# Patient Record
Sex: Female | Born: 1960 | Hispanic: Yes | Marital: Married | Smoking: Never smoker
Health system: Southern US, Community
[De-identification: ages and names within clinical notes are randomized; demographics above are authoritative.]

## PROBLEM LIST (undated history)

## (undated) DIAGNOSIS — R0789 Other chest pain: Secondary | ICD-10-CM

## (undated) HISTORY — PX: CHOLECYSTECTOMY: SHX55

## (undated) HISTORY — DX: Other chest pain: R07.89

## (undated) HISTORY — PX: APPENDECTOMY: SHX54

---

## 2008-09-06 ENCOUNTER — Observation Stay (HOSPITAL_COMMUNITY): Admission: EM | Admit: 2008-09-06 | Discharge: 2008-09-08 | Payer: Self-pay | Admitting: Internal Medicine

## 2008-09-07 ENCOUNTER — Encounter (INDEPENDENT_AMBULATORY_CARE_PROVIDER_SITE_OTHER): Payer: Self-pay | Admitting: Internal Medicine

## 2008-09-07 ENCOUNTER — Ambulatory Visit: Payer: Self-pay | Admitting: Internal Medicine

## 2008-09-27 DIAGNOSIS — Z9189 Other specified personal risk factors, not elsewhere classified: Secondary | ICD-10-CM

## 2008-09-28 ENCOUNTER — Ambulatory Visit: Payer: Self-pay

## 2008-09-28 ENCOUNTER — Ambulatory Visit: Payer: Self-pay | Admitting: Cardiovascular Disease

## 2008-10-05 ENCOUNTER — Ambulatory Visit: Payer: Self-pay | Admitting: Cardiology

## 2010-11-24 LAB — POCT CARDIAC MARKERS
CKMB, poc: <1 ng/mL — ABNORMAL LOW (ref 1.0–8.0)
Myoglobin, poc: 53.4 ng/mL (ref 12–200)
Myoglobin, poc: 94.2 ng/mL (ref 12–200)
Troponin i, poc: <0.05 ng/mL (ref 0.00–0.09)

## 2010-11-24 LAB — CBC
HCT: 33.2 % — ABNORMAL LOW (ref 36.0–46.0)
MCHC: 32.9 g/dL (ref 30.0–36.0)
Platelets: 286 10*3/uL (ref 150–400)
Platelets: 300 10*3/uL (ref 150–400)
RBC: 4.33 MIL/uL (ref 3.87–5.11)
RDW: 18.4 % — ABNORMAL HIGH (ref 11.5–15.5)
WBC: 10.4 10*3/uL (ref 4.0–10.5)
WBC: 4.2 10*3/uL (ref 4.0–10.5)
WBC: 5.9 10*3/uL (ref 4.0–10.5)

## 2010-11-24 LAB — LIPID PANEL
HDL: 40 mg/dL (ref >39)
Total CHOL/HDL Ratio: 5 RATIO
Triglycerides: 57 mg/dL (ref <150)

## 2010-11-24 LAB — CK TOTAL AND CKMB (NOT AT ARMC): Relative Index: INVALID (ref 0.0–2.5)

## 2010-11-24 LAB — CARDIAC PANEL(CRET KIN+CKTOT+MB+TROPI)
CK, MB: 1.2 ng/mL (ref 0.3–4.0)
CK, MB: 2 ng/mL (ref 0.3–4.0)
Relative Index: INVALID (ref 0.0–2.5)
Relative Index: INVALID (ref 0.0–2.5)
Total CK: 86 U/L (ref 7–177)
Troponin I: <0.01 ng/mL (ref 0.00–0.06)

## 2010-11-24 LAB — COMPREHENSIVE METABOLIC PANEL
ALT: 105 U/L — ABNORMAL HIGH (ref 0–35)
ALT: 82 U/L — ABNORMAL HIGH (ref 0–35)
AST: 136 U/L — ABNORMAL HIGH (ref 0–37)
AST: 78 U/L — ABNORMAL HIGH (ref 0–37)
Albumin: 3.2 g/dL — ABNORMAL LOW (ref 3.5–5.2)
Albumin: 3.2 g/dL — ABNORMAL LOW (ref 3.5–5.2)
Albumin: 3.6 g/dL (ref 3.5–5.2)
Alkaline Phosphatase: 124 U/L — ABNORMAL HIGH (ref 39–117)
Alkaline Phosphatase: 127 U/L — ABNORMAL HIGH (ref 39–117)
BUN: 13 mg/dL (ref 6–23)
Calcium: 8.7 mg/dL (ref 8.4–10.5)
Chloride: 103 mEq/L (ref 96–112)
Chloride: 104 mEq/L (ref 96–112)
Creatinine, Ser: 0.67 mg/dL (ref 0.4–1.2)
GFR calc Af Amer: >60 mL/min (ref >60)
Glucose, Bld: 87 mg/dL (ref 70–99)
Potassium: 3.3 mEq/L — ABNORMAL LOW (ref 3.5–5.1)
Potassium: 3.4 mEq/L — ABNORMAL LOW (ref 3.5–5.1)
Potassium: 3.4 mEq/L — ABNORMAL LOW (ref 3.5–5.1)
Sodium: 135 mEq/L (ref 135–145)
Total Bilirubin: 0.7 mg/dL (ref 0.3–1.2)
Total Bilirubin: 1.1 mg/dL (ref 0.3–1.2)
Total Protein: 6.1 g/dL (ref 6.0–8.3)
Total Protein: 7 g/dL (ref 6.0–8.3)

## 2010-11-24 LAB — IRON AND TIBC
Saturation Ratios: 12 % — ABNORMAL LOW (ref 20–55)
TIBC: 422 ug/dL (ref 250–470)
UIBC: 371 ug/dL

## 2010-11-24 LAB — DIFFERENTIAL
Basophils Absolute: 0 10*3/uL (ref 0.0–0.1)
Basophils Relative: 0 % (ref 0–1)
Eosinophils Absolute: 0 10*3/uL (ref 0.0–0.7)
Eosinophils Relative: 0 % (ref 0–5)
Monocytes Absolute: 0.2 10*3/uL (ref 0.1–1.0)
Monocytes Relative: 2 % — ABNORMAL LOW (ref 3–12)

## 2010-11-24 LAB — POCT PREGNANCY, URINE: Preg Test, Ur: NEGATIVE

## 2010-11-24 LAB — HEPATITIS C ANTIBODY: HCV Ab: NEGATIVE

## 2010-11-24 LAB — HEPATITIS B SURFACE ANTIBODY,QUALITATIVE: Hep B S Ab: NEGATIVE

## 2010-11-24 LAB — VITAMIN B12: Vitamin B-12: 765 pg/mL (ref 211–911)

## 2010-11-24 LAB — FOLATE: Folate: 19.3 ng/mL

## 2010-11-24 LAB — HEPATITIS A ANTIBODY, TOTAL: Hep A Total Ab: POSITIVE — AB

## 2010-11-24 LAB — TSH: TSH: 0.509 u[IU]/mL (ref 0.350–4.500)

## 2010-12-23 NOTE — H&P (Signed)
[name], [name] NO.:  [id number]   MEDICAL RECORD NO.:  [id number]          PATIENT TYPE:  EMS   LOCATION:  MAJO                         FACILITY:  [facility]   PHYSICIAN:  Eduard Clos, MDDATE OF BIRTH:  April 14, 1961   DATE OF ADMISSION:  09/06/2008  DATE OF DISCHARGE:                              HISTORY & PHYSICAL   CHIEF COMPLAINT:  Chest pain.   HISTORY OF PRESENT ILLNESS:  A 50 year old female with no significant  past medical history,  presented to the ER complaining of chest pain  which happened early in the a.m. today.  The chest pain occurred around  8 a.m.  Subsequent to that she also had some nausea.  In the ER the  patient was having more episodes of vomiting.  The patient denies any  abdominal pain, fever, chills, diarrhea or dysuria.  Chest pain is  retrosternal lasted for an hour and a half, and got relieved  spontaneously.  Presently has no chest pain.  There is no relation to  exertion.  No ulcers, shortness of breath or any radiation.  Denies any  palpitation, dizziness or loss of consciousness.  Denies any weakness of  limbs, headache.   PAST MEDICAL HISTORY:  Nothing significant.   PAST SURGICAL HISTORY:  Cholecystectomy and appendectomy.   MEDICATIONS ON ADMISSION:  None.   ALLERGIES:  None.   FAMILY HISTORY:  Noncontributory.   SOCIAL HISTORY:  The patient denies smoking cigarettes, drinking alcohol  or using illegal drugs.   REVIEW OF SYSTEMS:  As per history of present illness.  Nothing else  significant.   PHYSICAL EXAMINATION:  GENERAL:  The patient examined at the bedside not  in acute distress.  VITAL SIGNS:  Blood pressure 110/59, pulse 72, temperature 97.7,  respirations 18, oxygen saturation 100%.  HEENT: Anicteric.  No pallor.  CHEST:  Bilateral air entry present.  No rhonchi on auscultation.  HEART:  S1 and S2.  ABDOMEN:  Soft, nontender.  Bowel sounds heard.  No guarding or  rigidity.  CNS: Alert, awake,  oriented to person.  LOWER EXTREMITIES:  Peripheral pulses felt.  No edema.   LABORATORY DATA:  CBC:  WBC 10.4, hemoglobin 10.7, hematocrit 33.2, MCV  70.8, platelets 300,000, neutrophils 93%.  Complete metabolic panel:  Sodium 135, potassium 3.3, chloride 104, carbon dioxide 23, glucose 108,  BUN 12, creatinine 0.5, total bilirubin 1.1, alkaline phos of 124, AST  136, ALT 82, calcium 8.7, albumin 3.6.  CK-MB less than 1,  troponin-I  less than 0.05.  Pregnancy screen is negative.  EKG shows normal sinus  rhythm with no acute ST-T changes.  Chest x-ray:  Low lung volumes  without focal disease.   ASSESSMENT:  1. Chest pain, rule out acute coronary syndrome.  2. Abnormal liver function tests.   PLAN:  Admit the patient to telemetry, cycle cardiac markers, get a 2-D  echo.  The patient has abnormal LFTs, nausea and vomiting.  The patient  has had a previous cholecystectomy.  Anyway we will do a sonogram of the  abdomen to check for CBD dilatation.  If it is  there, we need an MRCP.  Will follow.  Repeat liver function test in a.m. and presently will  place the patient on cardiac healthy diet.  If the patient is still  having nausea and vomiting, may have to change it to clear liquid diet  or n.p.o.      Eduard Clos, MD  Electronically Signed     ANK/MEDQ  D:  09/06/2008  T:  09/06/2008  Job:  [id number]

## 2010-12-23 NOTE — Discharge Summary (Signed)
[name], [name]            ACCOUNT NO.:  [id number]   MEDICAL RECORD NO.:  [id number]          PATIENT TYPE:  INP   LOCATION:  4710                         FACILITY:  [facility]   PHYSICIAN:  Lucita Ferrara, MD         DATE OF BIRTH:  02/26/1961   DATE OF ADMISSION:  09/06/2008  DATE OF DISCHARGE:  09/08/2008                               DISCHARGE SUMMARY   DISCHARGE DIAGNOSES:  Chest pain, rule out acute coronary syndrome,  acute coronary syndrome ruled out, cardiac enzymes x3 are negative,  abnormal liver function tests including a complete metabolic panel which  showed alkaline phosphatase of 124, AST and ALT of 136 and 82  respectively.   Tests that are pending before discharge right now include hepatitis A,  B, C panel.  Issues to be addressed on followup include:  The patient is  to have an outpatient stress test.  Social worker will set this up with  [facility].  The patient lives in [place].  This  should be a nuclear stress test.   PROCEDURES:  The patient had a 2-D echocardiogram which showed an  ejection fraction of 65%, left ventricular wall thickness was normal and  overall normal systolic function.  The patient had a chest x-ray dated  September 06, 2008, which was normal.  Abdominal ultrasound which was also  negative.  There was some bowel gas pattern.   BRIEF HISTORY OF PRESENT ILLNESS:  [name] is a 50 year old  Hispanic female who presented to [facility] on September 06, 2008, with complaints of chest pain that was atypical.  The chest  pain was associated with some nausea, associated with food.  The patient  denied any fevers, chills, dysuria or specific abdominal pain.  Given  that her liver function tests were also abnormal an abdominal right  upper quadrant ultrasound was ordered and essentially this test proved  to be negative for acute cholecystitis or common bile duct dilatation.  Common bile duct was measured to  be 5.7.  The patient had an ACS  protocol as well.  Her EKG initially was normal.  Her cardiac enzymes x3  were normal.  Fasting lipid profile showed mildly elevated LDL at 150.  The patient was not started on any statins given her abnormal liver  function tests.  The rest of the workup for abnormal liver function  studies including hepatitis is currently pending.  The patient's pain  overall resolved.  The patient was initially put on clear liquid diet  and her chest pain was thought to be secondary to gastritis.  The  patient will be put on Protonix and discharged.  The patient did not  have any nausea or vomiting and tolerated diet.  Her chest pain  resolved.  Again, for full chest pain workup the patient is going to  have an outpatient stress test.   DISCHARGE MEDICATIONS:  1. She is to be discharged on aspirin and will change it to 81 mg p.o.      daily.  2. Protonix 40 mg p.o. daily.   FOLLOWUP:  Again, followup is as above.   DISCHARGE CONDITION:  Stable.     Lucita Ferrara, MD  Electronically Signed    RR/MEDQ  D:  09/08/2008  T:  09/08/2008  Job:  [id number]

## 2010-12-23 NOTE — Discharge Summary (Signed)
[name], [name] NO.:  [id number]   MEDICAL RECORD NO.:  [id number]           PATIENT TYPE:   LOCATION:                                 FACILITY:   PHYSICIAN:  Lucita Ferrara, MD              DATE OF BIRTH:   DATE OF ADMISSION:  DATE OF DISCHARGE:                               DISCHARGE SUMMARY   ADDENDUM:   DISCHARGE MEDICATIONS:  1. Protonix 40 mg p.o. daily.  2. Aspirin over-the-counter 81 mg p.o. daily.   FOLLOWUP:  1. Cardiology from [facility] in [place], phone number      [phone].  The patient is advised to call to make an      appointment.  2. Department of Social Services to apply for Medicaid.  Her social      work has been involved and told her exactly the procedures of this      process, phone number [phone].      Lucita Ferrara, MD  Electronically Signed     RR/MEDQ  D:  09/08/2008  T:  09/08/2008  Job:  [id number]

## 2010-12-23 NOTE — Assessment & Plan Note (Signed)
[facility]                       [place] CARDIOLOGY OFFICE NOTE   NAME:[name]                     MRN:          [id number]  DATE:10/05/2008                            DOB:          12/24/1960    REFERRING PHYSICIAN:  Dr. Lucita Schmitt of [facility].   REASON FOR CONSULTATION:  Chest pain.   HISTORY OF PRESENT ILLNESS:  [name] is a 50 year old Hispanic female  with no history of CAD, who presented to [facility] recently  with complaints of atypical chest pain.  She ruled out for myocardial  infarction by enzymes.  Her echocardiogram demonstrated normal LV  function with an EF of 65-75%.  She was noted to be anemic with an MCV  of 69.90 and a hemoglobin of 9.9.  Her iron studies were abnormal with a  ferritin of 5, B12 and folate were both normal.  She was placed on iron  therapy.  She was also placed on Protonix.  Her LFTs were noted to be  elevated with an alkaline phosphatase of 127, AST of 78 and an ALT of  105.  Abdominal ultrasound demonstrated a normal liver and a normal  common bile duct measuring 5.7 mm.  The pancreas could not be evaluated.  Her lipase was 23.  Hepatitis serologies were abnormal with hepatitis A  antibody being positive, hepatitis B and hepatitis C antibodies were  both negative.  The patient was set up by Dr. Flonnie Overman after discharge for  an exercise treadmill test in our office in [place].  This was done  on September 28, 2008.  The patient exercised for 6 minutes and 30  seconds and achieved 10.7 mets.  She had no evidence of ischemia by ST  analysis.  The patient achieved stage IV.  Her maximal heart rate was  173 beats per minute.  She had a normal blood pressure response.  In the  office today, she notes that she occasionally has chest discomfort from  time to time.  This is atypical.  It only lasts for a few seconds.  She  denies associated shortness of breath, nausea, diaphoresis,  syncope or  near-syncope.  On the day that she had to go to the hospital, she had  some worsening with exertion and some shortness of breath.  She also had  some nausea and vomiting that day as well.   PAST MEDICAL HISTORY:  As outlined above.  She is status post  appendectomy and cholecystectomy.  She denies any history of diabetes  mellitus or hypertension.  During her hospitalization, her lipid panel  was abnormal with total cholesterol of 201, triglycerides 57, HDL 40,  LDL 150.   CURRENT MEDICATIONS:  1. Iron 65 mg daily.  2. Aspirin 81 mg daily.  3. Protonix 40 mg daily.   ALLERGIES:  NO KNOWN DRUG ALLERGIES.   SOCIAL HISTORY:  She denies ever smoking.   FAMILY HISTORY:  Insignificant for premature CAD.   REVIEW OF SYSTEMS:  Please see HPI.  Denies melena, hematochezia or  dysuria.  She is still having periods.  She denies any symptoms of  menometrorrhagia.  The rest of the review of systems are negative.   PHYSICAL EXAMINATION:  GENERAL:  She is a well-nourished, well-developed  female.  VITAL SIGNS:  Blood pressure is 120/76, pulse 68, weight 191 pounds.  HEENT:  Normal.  NECK:  Without JVD.  Carotids without bruits bilaterally.  CARDIAC:  Normal S1-S2.  Regular rate and rhythm without murmur.  LUNGS:  Clear to auscultation bilaterally.  No wheezing, no rhonchi, no  rales.  ABDOMEN:  Soft and nontender with normoactive bowel sounds.  No  organomegaly.  EXTREMITIES:  Without edema.  NEUROLOGIC:  She is alert and oriented x3.  Cranial II-XII are grossly  intact.  SKIN:  Warm and dry.   ASSESSMENT/PLAN:  1. Chest pain.  [name] has very few risk factors and a recent      negative exercise treadmill test.  No further cardiovascular workup      is warranted at this time.  She can follow up with cardiology on a      p.r.n. basis.  2. Dyslipidemia.  She should pursue a trial of therapeutic lifestyle      modifications.  She can have a follow-up lipid panel with  her      primary care [name], Mr. [name] at [facility].  3. Elevated liver function tests.  As noted above, she had a negative      abdominal ultrasound.  She should follow up further with Mr. [name].  4. Microcytic anemia.  She is on iron therapy at this time.  This      seems to be secondary to iron deficiency.  She should continue iron      and follow-up with Mr. [name].   DISPOSITION:  The patient will follow up with cardiology on p.r.n.  basis.      Tereso Newcomer, PA-C  Electronically Signed      Jesse Sans. Daleen Squibb, MD, [facility]  Electronically Signed   SW/MedQ  DD: 10/05/2008  DT: 10/05/2008  Job #: [id number]   cc:   Sandra Ferrara, MD  Sandra Pascal, PA-C

## 2011-07-09 ENCOUNTER — Encounter: Payer: Self-pay | Admitting: Cardiovascular Disease

## 2012-11-22 ENCOUNTER — Ambulatory Visit (INDEPENDENT_AMBULATORY_CARE_PROVIDER_SITE_OTHER): Payer: 59 | Admitting: General Practice

## 2012-11-22 ENCOUNTER — Encounter: Payer: Self-pay | Admitting: General Practice

## 2012-11-22 VITALS — BP 114/67 | HR 56 | Temp 97.7°F | Ht 61.0 in | Wt 164.0 lb

## 2012-11-22 DIAGNOSIS — H8110 Benign paroxysmal vertigo, unspecified ear: Secondary | ICD-10-CM

## 2012-11-22 MED ORDER — MECLIZINE HCL 32 MG PO TABS: 32.0000 mg | ORAL_TABLET | Freq: Three times a day (TID) | ORAL | Status: DC | PRN

## 2012-11-22 NOTE — Progress Notes (Signed)
  Subjective:    Patient ID: [name], female    DOB: 1961/02/05, 52 y.o.   MRN: [id number]  HPI Reports having dizziness and feeling off balance. Reports having similar symptoms 3 years ago and resolved with medication. Denies falling, trauma, or recent head injury.    Review of Systems  Constitutional: Negative for fever and chills.  Respiratory: Negative for chest tightness and shortness of breath.   Cardiovascular: Negative for chest pain and palpitations.  Genitourinary: Negative for difficulty urinating.  Musculoskeletal: Negative.   Skin: Negative.   Neurological: Positive for dizziness and light-headedness.  Psychiatric/Behavioral: Negative.        Objective:   Physical Exam  Constitutional: She is oriented to person, place, and time. She appears well-developed and well-nourished.  HENT:  Head: Normocephalic and atraumatic.  Right Ear: External ear normal.  Left Ear: External ear normal.  Cardiovascular: Normal rate, regular rhythm and normal heart sounds.   No murmur heard. Pulmonary/Chest: Effort normal and breath sounds normal. No respiratory distress. She exhibits no tenderness.  Neurological: She is alert and oriented to person, place, and time.  Skin: Skin is warm and dry.  Psychiatric: She has a normal mood and affect.          Assessment & Plan:  Take medication as prescribed Sedation precautions discussed  Rest when vertigo attack presenting Patient verbalized understanding Raymon Mutton, FNP-C

## 2012-11-22 NOTE — Patient Instructions (Signed)
Vertigo Vertigo means you feel like you or your surroundings are moving when they are not. Vertigo can be dangerous if it occurs when you are at work, driving, or performing difficult activities.  CAUSES  Vertigo occurs when there is a conflict of signals sent to your brain from the visual and sensory systems in your body. There are many different causes of vertigo, including:  Infections, especially in the inner ear.  A bad reaction to a drug or misuse of alcohol and medicines.  Withdrawal from drugs or alcohol.  Rapidly changing positions, such as lying down or rolling over in bed.  A migraine headache.  Decreased blood flow to the brain.  Increased pressure in the brain from a head injury, infection, tumor, or bleeding. SYMPTOMS  You may feel as though the world is spinning around or you are falling to the ground. Because your balance is upset, vertigo can cause nausea and vomiting. You may have involuntary eye movements (nystagmus). DIAGNOSIS  Vertigo is usually diagnosed by physical exam. If the cause of your vertigo is unknown, your caregiver may perform imaging tests, such as an MRI scan (magnetic resonance imaging). TREATMENT  Most cases of vertigo resolve on their own, without treatment. Depending on the cause, your caregiver may prescribe certain medicines. If your vertigo is related to body position issues, your caregiver may recommend movements or procedures to correct the problem. In rare cases, if your vertigo is caused by certain inner ear problems, you may need surgery. HOME CARE INSTRUCTIONS   Follow your caregiver's instructions.  Avoid driving.  Avoid operating heavy machinery.  Avoid performing any tasks that would be dangerous to you or others during a vertigo episode.  Tell your caregiver if you notice that certain medicines seem to be causing your vertigo. Some of the medicines used to treat vertigo episodes can actually make them worse in some people. SEEK  IMMEDIATE MEDICAL CARE IF:   Your medicines do not relieve your vertigo or are making it worse.  You develop problems with talking, walking, weakness, or using your arms, hands, or legs.  You develop severe headaches.  Your nausea or vomiting continues or gets worse.  You develop visual changes.  A family member notices behavioral changes.  Your condition gets worse. MAKE SURE YOU:  Understand these instructions.  Will watch your condition.  Will get help right away if you are not doing well or get worse. Document Released: 05/06/2005 Document Revised: 10/19/2011 Document Reviewed: 02/12/2011 Exit[employer] Patient Information 2013 [employer].  

## 2013-08-31 ENCOUNTER — Telehealth: Payer: Self-pay | Admitting: General Practice

## 2013-08-31 NOTE — Telephone Encounter (Signed)
appt offered for tonight but they wanted one for tomorrow after noon appt given with mmm for 3:45

## 2013-09-01 ENCOUNTER — Ambulatory Visit (INDEPENDENT_AMBULATORY_CARE_PROVIDER_SITE_OTHER): Payer: 59 | Admitting: Nurse Practitioner

## 2013-09-01 ENCOUNTER — Encounter: Payer: Self-pay | Admitting: Nurse Practitioner

## 2013-09-01 VITALS — BP 115/60 | HR 72 | Temp 98.7°F | Ht 61.0 in | Wt 166.0 lb

## 2013-09-01 DIAGNOSIS — J069 Acute upper respiratory infection, unspecified: Secondary | ICD-10-CM

## 2013-09-01 MED ORDER — AZITHROMYCIN 250 MG PO TABS: ORAL_TABLET | ORAL | Status: DC

## 2013-09-01 MED ORDER — HYDROCODONE-HOMATROPINE 5-1.5 MG/5ML PO SYRP: 5.0000 mL | ORAL_SOLUTION | Freq: Three times a day (TID) | ORAL | Status: DC | PRN

## 2013-09-01 MED ORDER — METHYLPREDNISOLONE ACETATE 80 MG/ML IJ SUSP
80.0000 mg | Freq: Once | INTRAMUSCULAR | Status: AC
  Administered 2013-09-01: 80 mg via INTRAMUSCULAR

## 2013-09-01 NOTE — Progress Notes (Signed)
   Subjective:    Patient ID: [name], female    DOB: 01/23/1961, 53 y.o.   MRN: [id number]  HPI Patient in c/o cough and sore throat- started 2-3 weeks ago. No OTC meds    Review of Systems  Constitutional: Negative for fever, chills and fatigue.  HENT: Positive for congestion, ear pain, rhinorrhea and sore throat. Negative for sinus pressure and trouble swallowing.   Respiratory: Positive for cough (nonproductive).   Cardiovascular: Negative.   Genitourinary: Negative.   All other systems reviewed and are negative.       Objective:   Physical Exam  Constitutional: She is oriented to person, place, and time. She appears well-developed and well-nourished.  HENT:  Right Ear: Hearing, tympanic membrane, external ear and ear canal normal.  Left Ear: Hearing, tympanic membrane, external ear and ear canal normal.  Nose: Mucosal edema and rhinorrhea present. Right sinus exhibits no maxillary sinus tenderness and no frontal sinus tenderness. Left sinus exhibits no maxillary sinus tenderness and no frontal sinus tenderness.  Mouth/Throat: Uvula is midline, oropharynx is clear and moist and mucous membranes are normal.  Cardiovascular: Normal rate and normal heart sounds.   Pulmonary/Chest: Effort normal and breath sounds normal. No respiratory distress. She has no wheezes. She has no rales.  Dry hacky cough  Abdominal: Soft. Bowel sounds are normal.  Neurological: She is alert and oriented to person, place, and time.  Skin: Skin is warm and dry.  Psychiatric: She has a normal mood and affect. Her behavior is normal. Judgment and thought content normal.    BP 115/60  Pulse 72  Temp(Src) 98.7 F (37.1 C) (Oral)  Ht 5\' 1"  (1.549 m)  Wt 166 lb (75.297 kg)  BMI 31.38 kg/m2       Assessment & Plan:   1. Upper respiratory infection with cough and congestion    Meds ordered this encounter  Medications  . azithromycin (ZITHROMAX Z-PAK) 250 MG tablet    Sig: As directed    Dispense:  6 each    Refill:  0    Order Specific Question:  Supervising Provider    Answer:  Ernestina PennaMOORE, DONALD W [[id number]]  . HYDROcodone-homatropine (HYCODAN) 5-1.5 MG/5ML syrup    Sig: Take 5 mLs by mouth every 8 (eight) hours as needed for cough.    Dispense:  120 mL    Refill:  0    Order Specific Question:  Supervising Provider    Answer:  Ernestina PennaMOORE, DONALD W [[id number]]  . methylPREDNISolone acetate (DEPO-MEDROL) injection 80 mg    Sig:    1. Take meds as prescribed 2. Use a cool mist humidifier especially during the winter months and when heat has been humid. 3. Use saline nose sprays frequently 4. Saline irrigations of the nose can be very helpful if done frequently.  * 4X daily for 1 week*  * Use of a nettie pot can be helpful with this. Follow directions with this* 5. Drink plenty of fluids 6. Keep thermostat turn down low 7.For any cough or congestion  Use plain Mucinex- regular strength or max strength is fine   * Children- consult with Pharmacist for dosing 8. For fever or aces or pains- take tylenol or ibuprofen appropriate for age and weight.  * for fevers greater than 101 orally you may alternate ibuprofen and tylenol every  3 hours.   Sandra Daphine DeutscherMartin, FNP

## 2013-09-01 NOTE — Patient Instructions (Signed)

## 2014-11-07 ENCOUNTER — Telehealth: Payer: Self-pay | Admitting: Nurse Practitioner

## 2014-11-07 NOTE — Telephone Encounter (Signed)
Pt given appt with Christy tomorrow at 11:25.

## 2014-11-08 ENCOUNTER — Ambulatory Visit: Payer: Self-pay | Admitting: Family

## 2015-05-16 ENCOUNTER — Ambulatory Visit (INDEPENDENT_AMBULATORY_CARE_PROVIDER_SITE_OTHER): Payer: Managed Care, Other (non HMO) | Admitting: *Deleted

## 2015-05-16 DIAGNOSIS — Z23 Encounter for immunization: Secondary | ICD-10-CM

## 2015-08-28 ENCOUNTER — Encounter: Payer: Self-pay | Admitting: Family Medicine

## 2015-08-28 ENCOUNTER — Ambulatory Visit (INDEPENDENT_AMBULATORY_CARE_PROVIDER_SITE_OTHER): Payer: Managed Care, Other (non HMO) | Admitting: Family Medicine

## 2015-08-28 VITALS — BP 136/76 | HR 69 | Temp 97.1°F | Ht 61.0 in | Wt 187.2 lb

## 2015-08-28 DIAGNOSIS — H8110 Benign paroxysmal vertigo, unspecified ear: Secondary | ICD-10-CM | POA: Diagnosis not present

## 2015-08-28 MED ORDER — MECLIZINE HCL 32 MG PO TABS: 32.0000 mg | ORAL_TABLET | Freq: Three times a day (TID) | ORAL | Status: DC | PRN

## 2015-08-28 MED ORDER — ONDANSETRON 4 MG PO TBDP: 4.0000 mg | ORAL_TABLET | Freq: Three times a day (TID) | ORAL | Status: DC | PRN

## 2015-08-28 NOTE — Progress Notes (Signed)
BP 136/76 mmHg  Pulse 69  Temp(Src) 97.1 F (36.2 C) (Oral)  Ht  (1.549 m)  Wt 187 lb 3.2 oz (84.913 kg)  BMI 35.39 kg/m2  LMP 10/21/2012   Subjective:    Patient ID: [name], female    DOB: 27-Jul-1961, 55 y.o.   MRN: [id number]  HPI: [name] is a 55 y.o. female presenting on 08/28/2015 for Dizziness   HPI Dizziness and spinning Patient complains of dizziness and spinning that started last night and has been intermittent this morning as well. She is also had some nausea and some vomiting associated with that as well. She has gotten vertigo previously and this feels exactly like her vertigo. She describes the room is spinning around in circles and she feels like her eyes go up. It is also very positional and when she looks up it is a lot worse. Is also a lot worse when she gets up quickly. She has taken meclizine before and felt like it helped some and wants to try again.  Relevant past medical, surgical, family and social history reviewed and updated as indicated. Interim medical history since our last visit reviewed. Allergies and medications reviewed and updated.  Review of Systems  Constitutional: Negative for fever and chills.  HENT: Negative for congestion, ear discharge and ear pain.   Eyes: Negative for redness and visual disturbance.  Respiratory: Negative for chest tightness and shortness of breath.   Cardiovascular: Negative for chest pain and leg swelling.  Gastrointestinal: Positive for nausea. Negative for vomiting, abdominal pain and diarrhea.  Genitourinary: Negative for dysuria and difficulty urinating.  Musculoskeletal: Negative for back pain and gait problem.  Skin: Negative for rash.  Neurological: Positive for dizziness. Negative for light-headedness, numbness and headaches.  Psychiatric/Behavioral: Negative for behavioral problems and agitation.  All other systems reviewed and are negative.   Per HPI unless specifically indicated  above     Medication List       This list is accurate as of: 08/28/15  5:39 PM.  Always use your most recent med list.               meclizine 32 MG tablet  Commonly known as:  ANTIVERT  Take 1 tablet (32 mg total) by mouth 3 (three) times daily as needed.     ondansetron 4 MG disintegrating tablet  Commonly known as:  ZOFRAN-ODT  Take 1 tablet (4 mg total) by mouth every 8 (eight) hours as needed for nausea or vomiting.           Objective:    BP 136/76 mmHg  Pulse 69  Temp(Src) 97.1 F (36.2 C) (Oral)  Ht  (1.549 m)  Wt 187 lb 3.2 oz (84.913 kg)  BMI 35.39 kg/m2  LMP 10/21/2012  Wt Readings from Last 3 Encounters:  08/28/15 187 lb 3.2 oz (84.913 kg)  09/01/13 166 lb (75.297 kg)  11/22/12 164 lb (74.39 kg)    Physical Exam  Constitutional: She is oriented to person, place, and time. She appears well-developed and well-nourished. No distress.  Eyes: Conjunctivae and EOM are normal. Pupils are equal, round, and reactive to light.  Neck: Neck supple. No thyromegaly present.  Cardiovascular: Normal rate, regular rhythm, normal heart sounds and intact distal pulses.   No murmur heard. Pulmonary/Chest: Effort normal and breath sounds normal. No respiratory distress. She has no wheezes.  Musculoskeletal: Normal range of motion. She exhibits no edema or tenderness.  Lymphadenopathy:    She has  no cervical adenopathy.  Neurological: She is alert and oriented to person, place, and time. Coordination normal.  Skin: Skin is warm and dry. No rash noted. She is not diaphoretic.  Psychiatric: She has a normal mood and affect. Her behavior is normal.  Nursing note and vitals reviewed.     Assessment & Plan:   Problem List Items Addressed This Visit    None    Visit Diagnoses    Benign paroxysmal positional vertigo, unspecified laterality    -  Primary    Unable to elicit today, give meclizine and Zofran, if persists then may send to ENT if needed    Relevant  Medications    meclizine (ANTIVERT) 32 MG tablet    ondansetron (ZOFRAN-ODT) 4 MG disintegrating tablet        Follow up plan: Return if symptoms worsen or fail to improve, for Return for yearly labs and well exam.  Counseling provided for all of the vaccine components No orders of the defined types were placed in this encounter.    Arville Care, MD [facility] 08/28/2015, 5:39 PM

## 2016-02-27 ENCOUNTER — Telehealth: Payer: Self-pay | Admitting: Nurse Practitioner

## 2016-02-27 NOTE — Telephone Encounter (Signed)
[facility]

## 2016-05-12 ENCOUNTER — Ambulatory Visit (INDEPENDENT_AMBULATORY_CARE_PROVIDER_SITE_OTHER): Payer: Managed Care, Other (non HMO)

## 2016-05-12 DIAGNOSIS — Z23 Encounter for immunization: Secondary | ICD-10-CM

## 2018-05-13 ENCOUNTER — Ambulatory Visit (INDEPENDENT_AMBULATORY_CARE_PROVIDER_SITE_OTHER): Payer: Managed Care, Other (non HMO) | Admitting: Family Medicine

## 2018-05-13 ENCOUNTER — Encounter: Payer: Self-pay | Admitting: Family Medicine

## 2018-05-13 DIAGNOSIS — Z23 Encounter for immunization: Secondary | ICD-10-CM

## 2018-05-13 DIAGNOSIS — M7918 Myalgia, other site: Secondary | ICD-10-CM
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
## 2018-05-19 ENCOUNTER — Telehealth: Payer: Self-pay | Admitting: Nurse Practitioner

## 2018-05-19 NOTE — Telephone Encounter (Signed)
Letter written. Lm-cb 10/10

## 2018-05-25 NOTE — Telephone Encounter (Signed)
Phone call never returned, encounter closed

## 2018-12-22 ENCOUNTER — Telehealth: Payer: Self-pay | Admitting: Nurse Practitioner

## 2019-08-16 ENCOUNTER — Telehealth: Payer: Self-pay | Admitting: Nurse Practitioner

## 2019-08-16 ENCOUNTER — Ambulatory Visit: Payer: Self-pay | Admitting: Family Medicine
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
## 2019-09-05 ENCOUNTER — Ambulatory Visit: Payer: Self-pay | Admitting: Family Medicine

## 2020-07-26 ENCOUNTER — Encounter: Payer: Self-pay | Admitting: Family Medicine

## 2020-07-26 ENCOUNTER — Ambulatory Visit (INDEPENDENT_AMBULATORY_CARE_PROVIDER_SITE_OTHER): Payer: BC Managed Care – PPO | Admitting: Family Medicine

## 2020-07-26 DIAGNOSIS — Z8616 Personal history of COVID-19: Secondary | ICD-10-CM | POA: Diagnosis not present

## 2020-07-26 DIAGNOSIS — R059 Cough, unspecified: Secondary | ICD-10-CM

## 2020-07-26 DIAGNOSIS — U071 COVID-19: Secondary | ICD-10-CM

## 2020-07-26 MED ORDER — BENZONATATE 100 MG PO CAPS: 100.0000 mg | ORAL_CAPSULE | Freq: Two times a day (BID) | ORAL | 0 refills | Status: DC | PRN

## 2020-07-26 MED ORDER — ALBUTEROL SULFATE HFA 108 (90 BASE) MCG/ACT IN AERS: 2.0000 | INHALATION_SPRAY | Freq: Four times a day (QID) | RESPIRATORY_TRACT | 0 refills | Status: DC | PRN

## 2020-07-26 MED ORDER — DEXAMETHASONE 2 MG PO TABS: ORAL_TABLET | ORAL | 0 refills | Status: DC

## 2020-07-26 NOTE — Progress Notes (Signed)
Virtual Visit via telephone Note  I connected with [name] on 07/26/20 at 1300 by telephone and verified that I am speaking with the correct person using two identifiers. [name] is currently located at home and daughter are currently with her during visit. The provider, Elige Radon Alban Marucci, MD is located in their office at time of visit.  Call ended at 1313  I discussed the limitations, risks, security and privacy concerns of performing an evaluation and management service by telephone and the availability of in person appointments. I also discussed with the patient that there may be a patient responsible charge related to this service. The patient expressed understanding and agreed to proceed.   History and Present Illness: Patient was diagnosed with covid 07/12/20.  She was sick with cough and fevers and chills.  Now she just has cough and is worse at night. She has O2 98%. HR 65. She does not have any other symptoms except cough.  She lost taste and smell but they are coming back.   No diagnosis found.  Outpatient Encounter Medications as of 07/26/2020  Medication Sig  . cyclobenzaprine (FLEXERIL) 5 MG tablet Take 5 mg by mouth 3 (three) times daily.  Marland Kitchen ibuprofen (ADVIL,MOTRIN) 800 MG tablet Take 800 mg by mouth every 8 (eight) hours as needed.  . traMADol (ULTRAM) 50 MG tablet Take 50 mg by mouth every 8 (eight) hours as needed.   No facility-administered encounter medications on file as of 07/26/2020.    Review of Systems  Constitutional: Negative for chills and fever.  HENT: Positive for congestion. Negative for ear discharge, ear pain, postnasal drip, rhinorrhea, sinus pressure, sneezing and sore throat.   Eyes: Negative for pain, redness and visual disturbance.  Respiratory: Positive for cough. Negative for chest tightness, shortness of breath and wheezing.   Cardiovascular: Negative for chest pain and leg swelling.  Genitourinary: Negative for difficulty  urinating and dysuria.  Musculoskeletal: Negative for back pain and gait problem.  Skin: Negative for rash.  Neurological: Negative for light-headedness and headaches.  Psychiatric/Behavioral: Negative for agitation and behavioral problems.  All other systems reviewed and are negative.   Observations/Objective: Patient sounds comfortable and in no acute distress  Assessment and Plan: Problem List Items Addressed This Visit   None   Visit Diagnoses    COVID-19 virus infection    -  Primary   Relevant Medications   dexamethasone (DECADRON) 2 MG tablet   albuterol (VENTOLIN HFA) 108 (90 Base) MCG/ACT inhaler   benzonatate (TESSALON) 100 MG capsule   Cough       Relevant Medications   dexamethasone (DECADRON) 2 MG tablet   albuterol (VENTOLIN HFA) 108 (90 Base) MCG/ACT inhaler   benzonatate (TESSALON) 100 MG capsule     Sounds like patient is just still getting over the cough part of the Covid, sounds like she is improving, will give her some medicines to help clear the inflammation from the cough. Follow up plan: Return if symptoms worsen or fail to improve.     I discussed the assessment and treatment plan with the patient. The patient was provided an opportunity to ask questions and all were answered. The patient agreed with the plan and demonstrated an understanding of the instructions.   The patient was advised to call back or seek an in-person evaluation if the symptoms worsen or if the condition fails to improve as anticipated.  The above assessment and management plan was discussed with the patient. The patient verbalized  understanding of and has agreed to the management plan. Patient is aware to call the clinic if symptoms persist or worsen. Patient is aware when to return to the clinic for a follow-up visit. Patient educated on when it is appropriate to go to the emergency department.    I provided 13 minutes of non-face-to-face time during this encounter.    Nils Pyle, MD

## 2020-09-10 ENCOUNTER — Encounter: Payer: Self-pay | Admitting: Nurse Practitioner

## 2020-09-10 ENCOUNTER — Ambulatory Visit (INDEPENDENT_AMBULATORY_CARE_PROVIDER_SITE_OTHER): Payer: BC Managed Care – PPO | Admitting: Nurse Practitioner

## 2020-09-10 ENCOUNTER — Ambulatory Visit (INDEPENDENT_AMBULATORY_CARE_PROVIDER_SITE_OTHER): Payer: BC Managed Care – PPO

## 2020-09-10 ENCOUNTER — Other Ambulatory Visit: Payer: Self-pay

## 2020-09-10 VITALS — BP 128/69 | HR 52 | Temp 97.1°F | Resp 20 | Ht 61.0 in | Wt 185.0 lb

## 2020-09-10 DIAGNOSIS — M25562 Pain in left knee: Secondary | ICD-10-CM

## 2020-09-10 DIAGNOSIS — M25561 Pain in right knee: Secondary | ICD-10-CM | POA: Diagnosis not present

## 2020-09-10 MED ORDER — DICLOFENAC SODIUM 75 MG PO TBEC: 75.0000 mg | DELAYED_RELEASE_TABLET | Freq: Two times a day (BID) | ORAL | 0 refills | Status: AC

## 2020-09-10 NOTE — Progress Notes (Signed)
Acute Office Visit  Subjective:    Patient ID: [name], female    DOB: February 26, 1961, 60 y.o.   MRN: [id number]  Chief Complaint  Patient presents with  . Knee Pain    Bilateral     Knee Pain  The incident occurred more than 1 week ago. The incident occurred at work. The injury mechanism was a fall. The pain is present in the right knee and left knee. The pain is at a severity of 5/10. The pain is moderate. The pain has been intermittent since onset. Associated symptoms include an inability to bear weight and muscle weakness. She reports no foreign bodies present. The symptoms are aggravated by movement and palpation. She has tried nothing for the symptoms.     Past Medical History:  Diagnosis Date  . Atypical chest pain     Past Surgical History:  Procedure Laterality Date  . APPENDECTOMY    . CHOLECYSTECTOMY      Family History  Problem Relation Age of Onset  . Diabetes Mother   . Hypertension Neg Hx   . Coronary artery disease Neg Hx     Social History   Socioeconomic History  . Marital status: Married    Spouse name: Not on file  . Number of children: Not on file  . Years of education: Not on file  . Highest education level: Not on file  Occupational History  . Not on file  Tobacco Use  . Smoking status: Never Smoker  . Smokeless tobacco: Never Used  Substance and Sexual Activity  . Alcohol use: No  . Drug use: No  . Sexual activity: Not on file  Other Topics Concern  . Not on file  Social History Narrative  . Not on file   Social Determinants of Health   Financial Resource Strain: Not on file  Food Insecurity: Not on file  Transportation Needs: Not on file  Physical Activity: Not on file  Stress: Not on file  Social Connections: Not on file  Intimate Partner Violence: Not on file    Outpatient Medications Prior to Visit  Medication Sig Dispense Refill  . ferrous sulfate 325 (65 FE) MG tablet Take 1 tablet by mouth daily.    Marland Kitchen  albuterol (VENTOLIN HFA) 108 (90 Base) MCG/ACT inhaler Inhale 2 puffs into the lungs every 6 (six) hours as needed for wheezing or shortness of breath. 8 g 0  . benzonatate (TESSALON) 100 MG capsule Take 1 capsule (100 mg total) by mouth 2 (two) times daily as needed for cough. 20 capsule 0  . cyclobenzaprine (FLEXERIL) 5 MG tablet Take 5 mg by mouth 3 (three) times daily.  0  . dexamethasone (DECADRON) 2 MG tablet Take 4 tablets for 3 days then 3 tablets for 3 days then 2 tablets for 3 days then 1 tablet for 3 days 30 tablet 0  . ibuprofen (ADVIL,MOTRIN) 800 MG tablet Take 800 mg by mouth every 8 (eight) hours as needed.  0  . traMADol (ULTRAM) 50 MG tablet Take 50 mg by mouth every 8 (eight) hours as needed.  0   No facility-administered medications prior to visit.    No Known Allergies  Review of Systems  Constitutional: Negative.   HENT: Negative.   Eyes: Negative.   Respiratory: Negative.   Cardiovascular: Negative.   Genitourinary: Negative.   Musculoskeletal: Positive for arthralgias and myalgias.  Skin: Negative.   Neurological: Negative.   All other systems reviewed and are negative.  Objective:    Physical Exam Vitals reviewed.  Constitutional:      Appearance: Normal appearance. She is obese.  HENT:     Head: Normocephalic.     Nose: Nose normal.  Eyes:     Conjunctiva/sclera: Conjunctivae normal.  Cardiovascular:     Rate and Rhythm: Normal rate and regular rhythm.     Pulses: Normal pulses.     Heart sounds: Normal heart sounds.  Pulmonary:     Effort: Pulmonary effort is normal.     Breath sounds: Normal breath sounds.  Abdominal:     General: Bowel sounds are normal.  Musculoskeletal:        General: Tenderness present.  Skin:    General: Skin is warm.     Findings: Bruising present.  Neurological:     Mental Status: She is alert and oriented to person, place, and time.  Psychiatric:        Behavior: Behavior normal.     BP 128/69   Pulse  (!) 52   Temp (!) 97.1 F (36.2 C)   Resp 20   Ht 5\' 1"  (1.549 m)   Wt 185 lb (83.9 kg)   LMP 10/21/2012   SpO2 98%   BMI 34.96 kg/m  Wt Readings from Last 3 Encounters:  09/10/20 185 lb (83.9 kg)  05/13/18 206 lb (93.4 kg)  08/28/15 187 lb 3.2 oz (84.9 kg)    Health Maintenance Due  Topic Date Due  . HIV Screening  Never done  . TETANUS/TDAP  Never done  . COLONOSCOPY (Pts 45-83yrs Insurance coverage will need to be confirmed)  Never done  . MAMMOGRAM  02/12/2018  . PAP SMEAR-Modifier  07/02/2018  . INFLUENZA VACCINE  03/10/2020    There are no preventive care reminders to display for this patient.    Lab Results  Component Value Date   WBC 4.2 09/08/2008   HGB 9.9 (L) 09/08/2008   HCT 30.3 (L) 09/08/2008   MCV 69.9 (L) 09/08/2008   PLT 285 09/08/2008   Lab Results  Component Value Date   NA 138 09/08/2008   K 3.4 (L) 09/08/2008   CO2 24 09/08/2008   GLUCOSE 87 09/08/2008   BUN 13 09/08/2008   CREATININE 0.67 09/08/2008   BILITOT 0.6 09/08/2008   ALKPHOS 108 09/08/2008   AST 32 09/08/2008   ALT 74 (H) 09/08/2008   PROT 6.1 09/08/2008   ALBUMIN 3.2 (L) 09/08/2008   CALCIUM 8.7 09/08/2008   Lab Results  Component Value Date   CHOL (H) 09/06/2008    201        ATP III CLASSIFICATION:  <200     mg/dL   Desirable  09/08/2008  mg/dL   Borderline High  546-503    mg/dL   High          Lab Results  Component Value Date   HDL 40 09/06/2008   Lab Results  Component Value Date   LDLCALC (H) 09/06/2008    150        Total Cholesterol/HDL:CHD Risk Coronary Heart Disease Risk Table                     Men   Women  1/2 Average Risk   3.4   3.3  Average Risk       5.0   4.4  2 X Average Risk   9.6   7.1  3 X Average Risk  23.4   11.0  Use the calculated Patient Ratio above and the CHD Risk Table to determine the patient's CHD Risk.        ATP III CLASSIFICATION (LDL):  <100     mg/dL   Optimal  726-203  mg/dL   Near or Above                     Optimal  130-159  mg/dL   Borderline  559-741  mg/dL   High  >638     mg/dL   Very High   Lab Results  Component Value Date   TRIG 57 09/06/2008   Lab Results  Component Value Date   CHOLHDL 5.0 09/06/2008   No results found for: HGBA1C     Assessment & Plan:   Problem List Items Addressed This Visit      Other   Pain in both knees - Primary    Bilateral knee pain not controlled.  Patient reports fall accident  at work 3 years ago where she hit her knee on the palate.  patient rates pain 5/10.  Denies numbness and tingling. Started patient on Voltaren 75 mg tablet twice daily. Completed x-ray bilateral knee with results pending. Education provided to patient with printed handouts given. Rx sent to pharmacy. Follow-up with worsening or unresolved symptoms.       Relevant Orders   DG Knee 1-2 Views Left   DG Knee 1-2 Views Right       Meds ordered this encounter  Medications  . diclofenac (VOLTAREN) 75 MG EC tablet    Sig: Take 1 tablet (75 mg total) by mouth 2 (two) times daily.    Dispense:  30 tablet    Refill:  0    Order Specific Question:   Supervising Provider    AnswerCorey Harold     Daryll Drown, NP

## 2020-09-10 NOTE — Patient Instructions (Signed)
Acute Knee Pain, Adult Many things can cause knee pain. Sometimes, knee pain is sudden (acute) and may be caused by damage, swelling, or irritation of the muscles and tissues that support your knee. The pain often goes away on its own with time and rest. If the pain does not go away, tests may be done to find out what is causing the pain. Follow these instructions at home: If you have a knee sleeve or brace:  Wear the knee sleeve or brace as told by your doctor. Take it off only as told by your doctor.  Loosen it if your toes: ? Tingle. ? Become numb. ? Turn cold and blue.  Keep it clean.  If the knee sleeve or brace is not waterproof: ? Do not let it get wet. ? Cover it with a watertight covering when you take a bath or shower.   Activity  Rest your knee.  Do not do things that cause pain or make pain worse.  Avoid activities where both feet leave the ground at the same time (high-impact activities). Examples are running, jumping rope, and doing jumping jacks.  Work with a physical therapist to make a safe exercise program, as told by your doctor. Managing pain, stiffness, and swelling  If told, put ice on the knee. To do this: ? If you have a removable knee sleeve or brace, take it off as told by your doctor. ? Put ice in a plastic bag. ? Place a towel between your skin and the bag. ? Leave the ice on for 20 minutes, 2-3 times a day. ? Take off the ice if your skin turns bright red. This is very important. If you cannot feel pain, heat, or cold, you have a greater risk of damage to the area.  If told, use an elastic bandage to put pressure (compression) on your injured knee.  Raise your knee above the level of your heart while you are sitting or lying down.  Sleep with a pillow under your knee.   General instructions  Take over-the-counter and prescription medicines only as told by your doctor.  Do not smoke or use any products that contain nicotine or tobacco. If you  need help quitting, ask your doctor.  If you are overweight, work with your doctor and a food expert (dietitian) to set goals to lose weight. Being overweight can make your knee hurt more.  Watch for any changes in your symptoms.  Keep all follow-up visits. Contact a doctor if:  The knee pain does not stop.  The knee pain changes or gets worse.  You have a fever along with knee pain.  Your knee is red or feels warm when you touch it.  Your knee gives out or locks up. Get help right away if:  Your knee swells, and the swelling gets worse.  You cannot move your knee.  You have very bad knee pain that does not get better with pain medicine. Summary  Many things can cause knee pain. The pain often goes away on its own with time and rest.  Your doctor may do tests to find out the cause of the pain.  Watch for any changes in your symptoms. Relieve your pain with rest, medicines, light activity, and use of ice.  Get help right away if you cannot move your knee or your knee pain is very bad. This information is not intended to replace advice given to you by your health care provider. Make sure you discuss   any questions you have with your health care provider. Document Revised: 01/10/2020 Document Reviewed: 01/10/2020 Elsevier Patient Education  2021 [employer]  

## 2020-09-10 NOTE — Assessment & Plan Note (Signed)
Bilateral knee pain not controlled.  Patient reports fall accident  at work 3 years ago where she hit her knee on the palate.  patient rates pain 5/10.  Denies numbness and tingling. Started patient on Voltaren 75 mg tablet twice daily. Completed x-ray bilateral knee with results pending. Education provided to patient with printed handouts given. Rx sent to pharmacy. Follow-up with worsening or unresolved symptoms.

## 2022-05-12 ENCOUNTER — Ambulatory Visit (INDEPENDENT_AMBULATORY_CARE_PROVIDER_SITE_OTHER): Payer: BC Managed Care – PPO | Admitting: Nurse Practitioner

## 2022-05-12 ENCOUNTER — Telehealth: Payer: Self-pay | Admitting: Nurse Practitioner

## 2022-05-12 ENCOUNTER — Other Ambulatory Visit: Payer: Self-pay | Admitting: Nurse Practitioner

## 2022-05-12 ENCOUNTER — Encounter: Payer: Self-pay | Admitting: Nurse Practitioner

## 2022-05-12 DIAGNOSIS — U071 COVID-19: Secondary | ICD-10-CM

## 2022-05-12 DIAGNOSIS — J069 Acute upper respiratory infection, unspecified: Secondary | ICD-10-CM

## 2022-05-12 MED ORDER — MOLNUPIRAVIR EUA 200MG CAPSULE: 4.0000 | ORAL_CAPSULE | Freq: Two times a day (BID) | ORAL | 0 refills | Status: AC

## 2022-05-12 MED ORDER — BENZONATATE 100 MG PO CAPS: 100.0000 mg | ORAL_CAPSULE | Freq: Three times a day (TID) | ORAL | 0 refills | Status: AC | PRN

## 2022-05-12 MED ORDER — GUAIFENESIN ER 600 MG PO TB12: 600.0000 mg | ORAL_TABLET | Freq: Two times a day (BID) | ORAL | 0 refills | Status: AC

## 2022-05-12 NOTE — Progress Notes (Signed)
   Virtual Visit  Note Due to COVID-19 pandemic this visit was conducted virtually. This visit type was conducted due to national recommendations for restrictions regarding the COVID-19 Pandemic (e.g. social distancing, sheltering in place) in an effort to limit this patient's exposure and mitigate transmission in our community. All issues noted in this document were discussed and addressed.  A physical exam was not performed with this format.  I connected with [name] on 05/12/22 at 1:24 pm by telephone and verified that I am speaking with the correct person using two identifiers. [name] is currently located at home during visit. The provider, Ivy Lynn, NP is located in their office at time of visit.  I discussed the limitations, risks, security and privacy concerns of performing an evaluation and management service by telephone and the availability of in person appointments. I also discussed with the patient that there may be a patient responsible charge related to this service. The patient expressed understanding and agreed to proceed.   History and Present Illness:  URI  This is a new problem. Episode onset: in the past 2 days. The problem has been unchanged. There has been no fever. Associated symptoms include congestion and coughing. Pertinent negatives include no abdominal pain, headaches, rash, sore throat, swollen glands, vomiting or wheezing. Treatments tried: night quill. The treatment provided no relief.      Review of Systems  Constitutional: Negative.  Negative for chills and fever.  HENT:  Positive for congestion. Negative for sore throat.   Respiratory:  Positive for cough. Negative for wheezing.   Cardiovascular: Negative.   Gastrointestinal:  Negative for abdominal pain and vomiting.  Skin: Negative.  Negative for itching and rash.  Neurological:  Negative for headaches.  All other systems reviewed and are  negative.    Observations/Objective: Tele-visit   Assessment and Plan: Presents with cough, congestion in the past 2-3 days.  Take meds as prescribed - Use a cool mist humidifier  -Use saline nose sprays frequently -Force fluids -For fever or aches or pains- take Tylenol or ibuprofen. -If symptoms do not improve, she may need to be COVID tested to rule this out   Follow Up Instructions: Follow up with unresolved symptoms    I discussed the assessment and treatment plan with the patient. The patient was provided an opportunity to ask questions and all were answered. The patient agreed with the plan and demonstrated an understanding of the instructions.   The patient was advised to call back or seek an in-person evaluation if the symptoms worsen or if the condition fails to improve as anticipated.  The above assessment and management plan was discussed with the patient. The patient verbalized understanding of and has agreed to the management plan. Patient is aware to call the clinic if symptoms persist or worsen. Patient is aware when to return to the clinic for a follow-up visit. Patient educated on when it is appropriate to go to the emergency department.   Time call ended:  1:35 pm   I provided 11 minutes of  non face-to-face time during this encounter.    Ivy Lynn, NP

## 2022-05-12 NOTE — Telephone Encounter (Signed)
I sent antiviral medication to patients pharmacy

## 2022-05-13 NOTE — Telephone Encounter (Signed)
Aware and verbalizes understanding.  

## 2022-09-09 IMAGING — DX DG KNEE 1-2V*R*
2 series · 2 of 2 positions shown · non-contrast
Comparison: No prior.

CLINICAL DATA: Bilateral knee pain.

EXAM:
RIGHT KNEE - 1-2 VIEW

[knee ap]
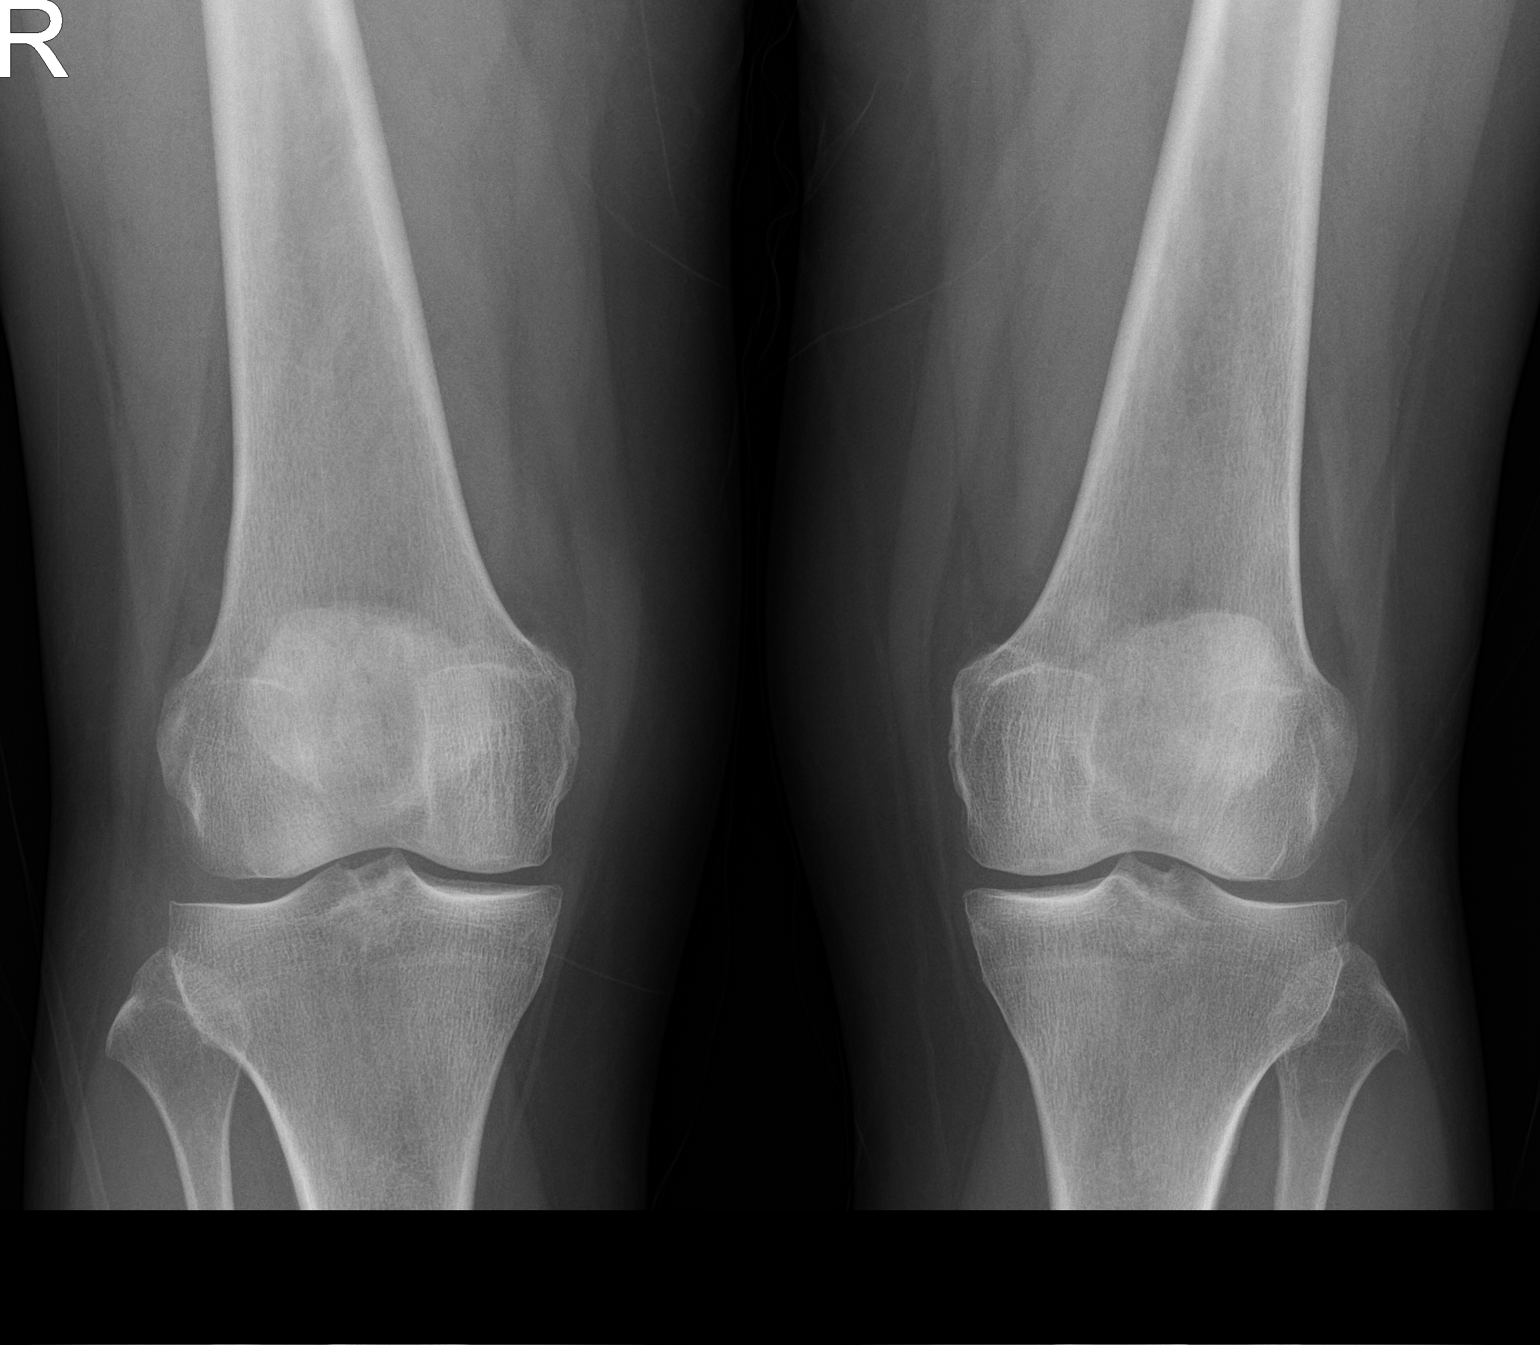

[knee lat]
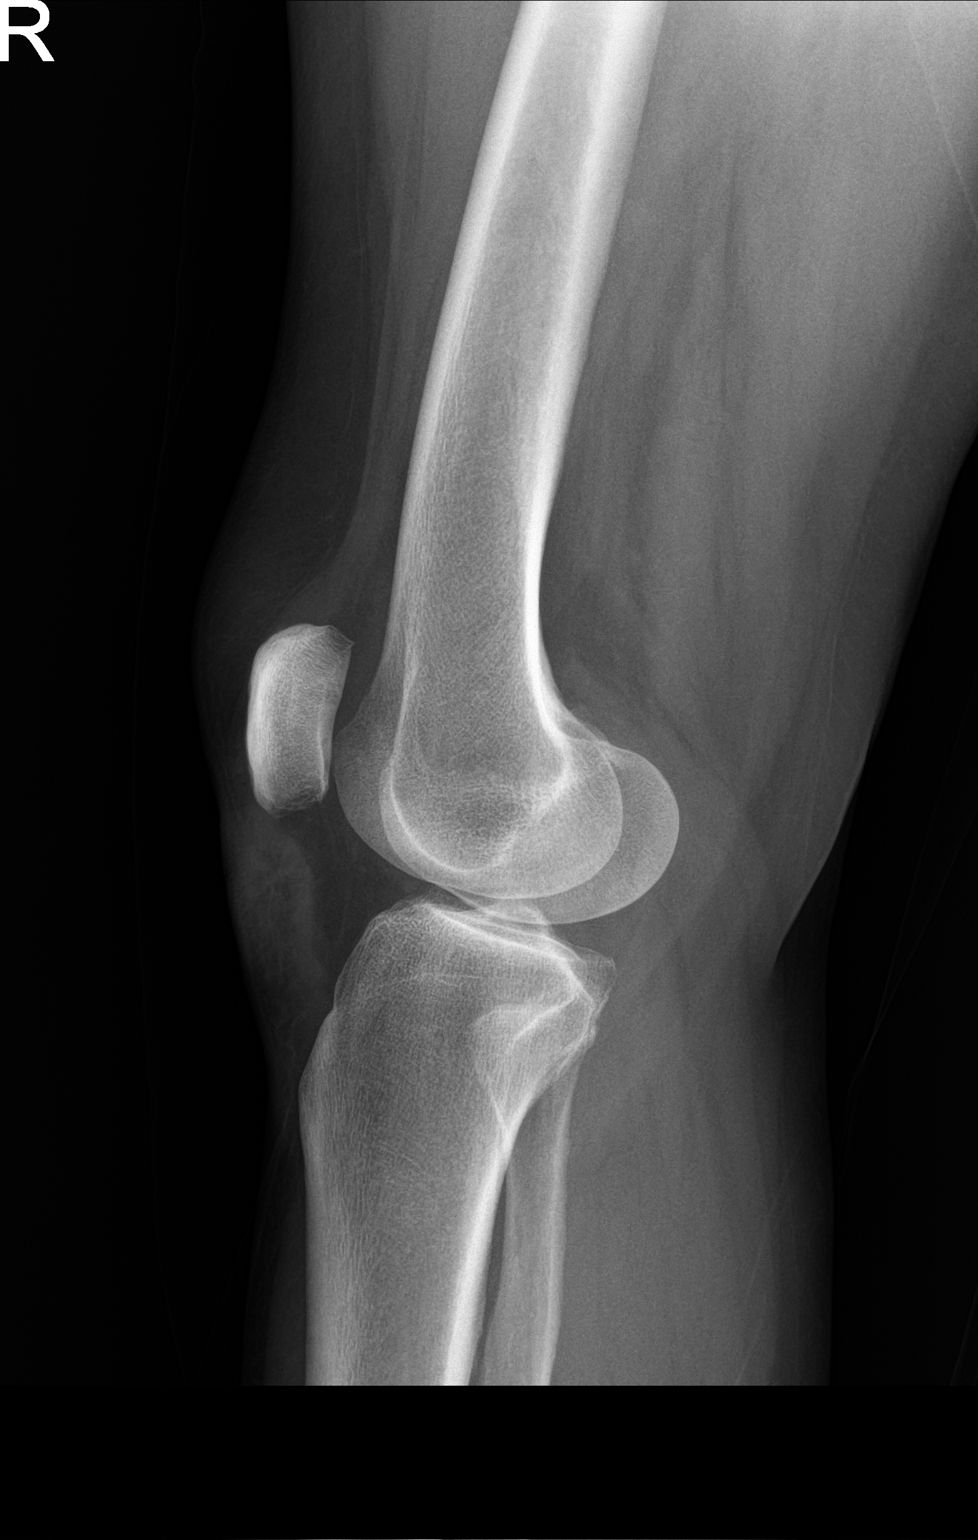

[2 of 2 positions shown; findings below may reference images not displayed]

FINDINGS: Right infrapatellar soft tissue swelling. A right infrapatellar
tendinous process including injury cannot be excluded. If further
evaluation is needed MRI should be considered. No effusion noted. No
fracture or dislocation.
IMPRESSION: Right infrapatellar soft tissue swelling. A right infrapatellar
tendinous process including injury cannot be excluded. If further
evaluation is needed MRI should be considered. No effusion noted.

## 2023-05-31 ENCOUNTER — Telehealth: Payer: Self-pay

## 2023-05-31 NOTE — Transitions of Care (Post Inpatient/ED Visit) (Signed)
   05/31/2023  Name: [name] MRN: [id number] DOB: [birth year]  Today's TOC FU Call Status: Today's TOC FU Call Status:: Successful TOC FU Call Completed TOC FU Call Complete Date: 05/31/23 Patient's Name and Date of Birth confirmed.  Transition Care Management Follow-up Telephone Call Date of Discharge: 05/30/23 Type of Discharge: Inpatient Admission Primary Inpatient Discharge Diagnosis:: cellutiis How have you been since you were released from the hospital?: Better Any questions or concerns?: No  Items Reviewed: Did you receive and understand the discharge instructions provided?: Yes Medications obtained,verified, and reconciled?: Yes (Medications Reviewed) Any new allergies since your discharge?: No Dietary orders reviewed?: Yes Do you have support at home?: Yes People in Home: child(ren), adult  Medications Reviewed Today: Medications Reviewed Today     Reviewed by Karena Addison, LPN (Licensed Practical Nurse) on 05/31/23 at 1451  Med List Status: <None>   Medication Order Taking? Sig Documenting Provider Last Dose Status Informant  benzonatate (TESSALON PERLES) 100 MG capsule [id number]  Take 1 capsule (100 mg total) by mouth 3 (three) times daily as needed. Daryll Drown, NP  Active   diclofenac (VOLTAREN) 75 MG EC tablet [id number]  Take 1 tablet (75 mg total) by mouth 2 (two) times daily. Daryll Drown, NP  Active   ferrous sulfate 325 (65 FE) MG tablet [id number]  Take 1 tablet by mouth daily. [provider]  Active   guaiFENesin (MUCINEX) 600 MG 12 hr tablet [id number]  Take 1 tablet (600 mg total) by mouth 2 (two) times daily. Daryll Drown, NP  Active             Home Care and Equipment/Supplies: Were Home Health Services Ordered?: NA Any new equipment or medical supplies ordered?: NA  Functional Questionnaire: Do you need assistance with bathing/showering or dressing?: No Do you need assistance with meal preparation?: No Do you need  assistance with eating?: No Do you have difficulty maintaining continence: No Do you need assistance with getting out of bed/getting out of a chair/moving?: No Do you have difficulty managing or taking your medications?: No  Follow up appointments reviewed: PCP Follow-up appointment confirmed?: No (patient will call office to schedule) MD Provider Line Number:[phone] Given: No Specialist Hospital Follow-up appointment confirmed?: NA Do you need transportation to your follow-up appointment?: No Do you understand care options if your condition(s) worsen?: Yes-patient verbalized understanding    SIGNATURE Karena Addison, LPN [facility] Nurse Health Advisor Direct Dial [phone]

## 2023-06-04 ENCOUNTER — Ambulatory Visit: Payer: BC Managed Care – PPO | Admitting: Family Medicine
